# Patient Record
Sex: Female | Born: 1990 | Race: White | Hispanic: Yes | Marital: Single | Smoking: Never smoker
Health system: Southern US, Community
[De-identification: ages and names within clinical notes are randomized; demographics above are authoritative.]

## PROBLEM LIST (undated history)

## (undated) DIAGNOSIS — R87629 Unspecified abnormal cytological findings in specimens from vagina: Secondary | ICD-10-CM

## (undated) DIAGNOSIS — E039 Hypothyroidism, unspecified: Secondary | ICD-10-CM

## (undated) DIAGNOSIS — M419 Scoliosis, unspecified: Secondary | ICD-10-CM

## (undated) HISTORY — PX: OVARIAN CYST REMOVAL: SHX89

## (undated) HISTORY — PX: HERNIA REPAIR: SHX51

## (undated) HISTORY — PX: APPENDECTOMY: SHX54

## (undated) HISTORY — DX: Unspecified abnormal cytological findings in specimens from vagina: R87.629

## (undated) HISTORY — PX: OTHER SURGICAL HISTORY: SHX169

## (undated) HISTORY — PX: TONSILLECTOMY: SUR1361

## (undated) HISTORY — DX: Hypothyroidism, unspecified: E03.9

## (undated) HISTORY — DX: Scoliosis, unspecified: M41.9

---
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
## 2009-04-11 ENCOUNTER — Ambulatory Visit (HOSPITAL_COMMUNITY): Admission: RE | Admit: 2009-04-11 | Discharge: 2009-04-11 | Payer: Self-pay | Admitting: Obstetrics and Gynecology

## 2009-07-25 ENCOUNTER — Inpatient Hospital Stay (HOSPITAL_COMMUNITY): Admission: AD | Admit: 2009-07-25 | Discharge: 2009-07-27 | Payer: Self-pay | Admitting: Obstetrics and Gynecology

## 2010-05-01 LAB — CBC
HCT: 26.1 % — ABNORMAL LOW (ref 36.0–46.0)
Hemoglobin: 11.9 g/dL — ABNORMAL LOW (ref 12.0–15.0)
Hemoglobin: 9.2 g/dL — ABNORMAL LOW (ref 12.0–15.0)
MCHC: 34.8 g/dL (ref 30.0–36.0)
MCV: 97.8 fL (ref 78.0–100.0)
Platelets: 179 10*3/uL (ref 150–400)
RBC: 2.68 MIL/uL — ABNORMAL LOW (ref 3.87–5.11)
RBC: 3.52 MIL/uL — ABNORMAL LOW (ref 3.87–5.11)
WBC: 15.3 10*3/uL — ABNORMAL HIGH (ref 4.0–10.5)
WBC: 15.5 10*3/uL — ABNORMAL HIGH (ref 4.0–10.5)

## 2010-05-01 LAB — RH IMMUNE GLOB WKUP(>/=20WKS)(NOT WOMEN'S HOSP)

## 2010-05-04 LAB — RH IMMUNE GLOBULIN WORKUP (NOT WOMEN'S HOSP)
ABO/RH(D): A NEG
Antibody Screen: NEGATIVE

## 2011-05-07 ENCOUNTER — Emergency Department (HOSPITAL_COMMUNITY): Payer: BC Managed Care – PPO

## 2011-05-07 ENCOUNTER — Emergency Department (HOSPITAL_COMMUNITY)
Admission: EM | Admit: 2011-05-07 | Discharge: 2011-05-07 | Disposition: A | Discharge status: Home / Self Care | Payer: BC Managed Care – PPO | Attending: Emergency Medicine | Admitting: Emergency Medicine

## 2011-05-07 ENCOUNTER — Encounter (HOSPITAL_COMMUNITY): Payer: Self-pay

## 2011-05-07 DIAGNOSIS — S42009A Fracture of unspecified part of unspecified clavicle, initial encounter for closed fracture: Secondary | ICD-10-CM | POA: Insufficient documentation

## 2011-05-07 DIAGNOSIS — Y92410 Unspecified street and highway as the place of occurrence of the external cause: Secondary | ICD-10-CM | POA: Insufficient documentation

## 2011-05-07 DIAGNOSIS — S42002A Fracture of unspecified part of left clavicle, initial encounter for closed fracture: Secondary | ICD-10-CM

## 2011-05-07 DIAGNOSIS — M25519 Pain in unspecified shoulder: Secondary | ICD-10-CM | POA: Insufficient documentation

## 2011-05-07 DIAGNOSIS — M79609 Pain in unspecified limb: Secondary | ICD-10-CM | POA: Insufficient documentation

## 2011-05-07 DIAGNOSIS — R112 Nausea with vomiting, unspecified: Secondary | ICD-10-CM | POA: Insufficient documentation

## 2011-05-07 LAB — DIFFERENTIAL
Basophils Relative: 1 % (ref 0–1)
Eosinophils Absolute: 0.2 10*3/uL (ref 0.0–0.7)
Lymphs Abs: 1.9 10*3/uL (ref 0.7–4.0)
Monocytes Absolute: 0.5 10*3/uL (ref 0.1–1.0)
Monocytes Relative: 5 % (ref 3–12)

## 2011-05-07 LAB — CBC
HCT: 34.7 % — ABNORMAL LOW (ref 36.0–46.0)
Hemoglobin: 11.6 g/dL — ABNORMAL LOW (ref 12.0–15.0)
MCH: 29.6 pg (ref 26.0–34.0)
MCHC: 33.4 g/dL (ref 30.0–36.0)
MCV: 88.5 fL (ref 78.0–100.0)
RBC: 3.92 MIL/uL (ref 3.87–5.11)

## 2011-05-07 LAB — POCT I-STAT, CHEM 8
BUN: 14 mg/dL (ref 6–23)
Chloride: 106 mEq/L (ref 96–112)
Creatinine, Ser: 0.6 mg/dL (ref 0.50–1.10)
Sodium: 141 mEq/L (ref 135–145)
TCO2: 24 mmol/L (ref 0–100)

## 2011-05-07 LAB — URINE MICROSCOPIC-ADD ON

## 2011-05-07 LAB — URINALYSIS, ROUTINE W REFLEX MICROSCOPIC
Bilirubin Urine: NEGATIVE
Glucose, UA: NEGATIVE mg/dL
Protein, ur: NEGATIVE mg/dL
Urobilinogen, UA: 0.2 mg/dL (ref 0.0–1.0)

## 2011-05-07 MED ORDER — ONDANSETRON HCL 4 MG/2ML IJ SOLN
INTRAMUSCULAR | Status: AC
  Administered 2011-05-07: 4 mg via INTRAVENOUS
  Filled 2011-05-07: qty 2

## 2011-05-07 MED ORDER — MORPHINE SULFATE 4 MG/ML IJ SOLN
4.0000 mg | Freq: Once | INTRAMUSCULAR | Status: AC
  Administered 2011-05-07: 4 mg via INTRAVENOUS
  Filled 2011-05-07: qty 1

## 2011-05-07 MED ORDER — ONDANSETRON HCL 4 MG/2ML IJ SOLN
INTRAMUSCULAR | Status: AC
  Administered 2011-05-07: 4 mg
  Filled 2011-05-07: qty 2

## 2011-05-07 MED ORDER — ONDANSETRON HCL 4 MG/2ML IJ SOLN
4.0000 mg | Freq: Once | INTRAMUSCULAR | Status: AC
  Administered 2011-05-07: 4 mg via INTRAVENOUS

## 2011-05-07 MED ORDER — ONDANSETRON 4 MG PO TBDP: 4.0000 mg | ORAL_TABLET | Freq: Three times a day (TID) | ORAL | Status: AC | PRN

## 2011-05-07 MED ORDER — OXYCODONE-ACETAMINOPHEN 5-325 MG PO TABS: 1.0000 | ORAL_TABLET | ORAL | Status: AC | PRN

## 2011-05-07 MED ORDER — SODIUM CHLORIDE 0.9 % IV SOLN
INTRAVENOUS | Status: DC
  Administered 2011-05-07: 125 mL/h via INTRAVENOUS
  Administered 2011-05-07: 18:00:00 via INTRAVENOUS

## 2011-05-07 MED ORDER — ONDANSETRON HCL 4 MG/2ML IJ SOLN
4.0000 mg | Freq: Once | INTRAMUSCULAR | Status: AC
  Administered 2011-05-07: 4 mg via INTRAVENOUS
  Filled 2011-05-07: qty 2
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
## 2011-05-07 NOTE — Progress Notes (Signed)
Orthopedic Tech Progress Note Patient Details:  Laura Bolton July 13, 1990 [id number]  Other Ortho Devices Type of Ortho Device: Other (comment) (clavical strap;foam arm sling) Ortho Device Location: left arm Ortho Device Interventions: Application   Nikki Dom 05/07/2011, 7:51 PM

## 2011-05-08 ENCOUNTER — Other Ambulatory Visit: Payer: Self-pay | Admitting: Sports Medicine

## 2011-05-08 ENCOUNTER — Ambulatory Visit
Admission: RE | Admit: 2011-05-08 | Discharge: 2011-05-08 | Disposition: A | Discharge status: Home / Self Care | Payer: BC Managed Care – PPO | Source: Ambulatory Visit | Attending: Sports Medicine | Admitting: Sports Medicine

## 2011-05-08 DIAGNOSIS — T148XXA Other injury of unspecified body region, initial encounter: Secondary | ICD-10-CM

## 2011-05-08 DIAGNOSIS — R52 Pain, unspecified: Secondary | ICD-10-CM

## 2012-06-17 ENCOUNTER — Encounter: Payer: Self-pay | Admitting: Obstetrics and Gynecology
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
## 2013-06-13 IMAGING — CT CT CHEST LIMITED W/O CM
3 of 4 series · 17 of 30 positions shown, 20 images · non-contrast
Comparison: Left clavicle fracture.

CLINICAL DATA: CHEST CT LIMITED WITHOUT CONTRAST
TECHNIQUE: Multidetector CT imaging of the left clavicle was
performed using standard protocol with coronal and sagittal
reformatted images.

[Series 2: limited chest/clavicle · axial · 0.70mm/px · z∈[-109,-26]mm · 4 of 49 slices shown]
[im 13/49  lung]
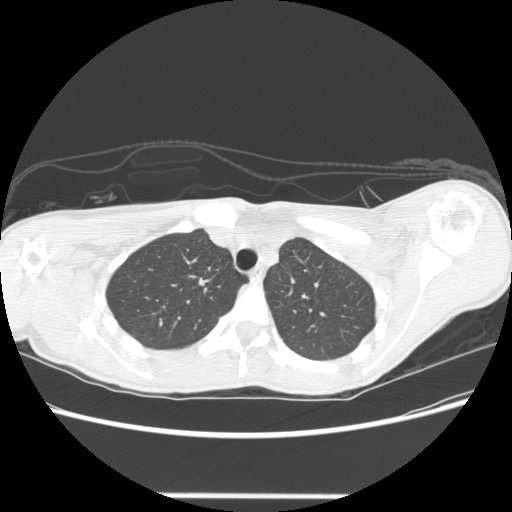
[im 25/49  lung]
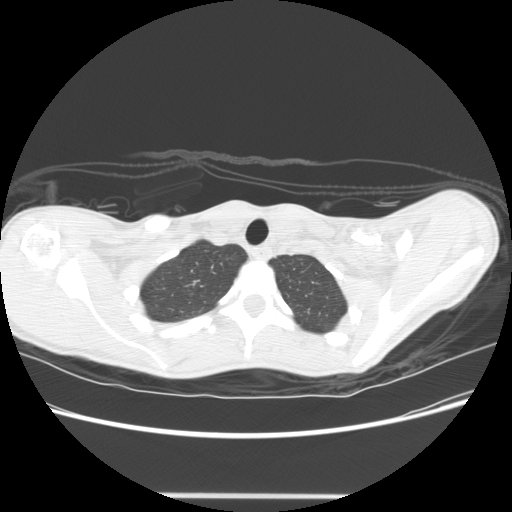
[im 37/49  lung]
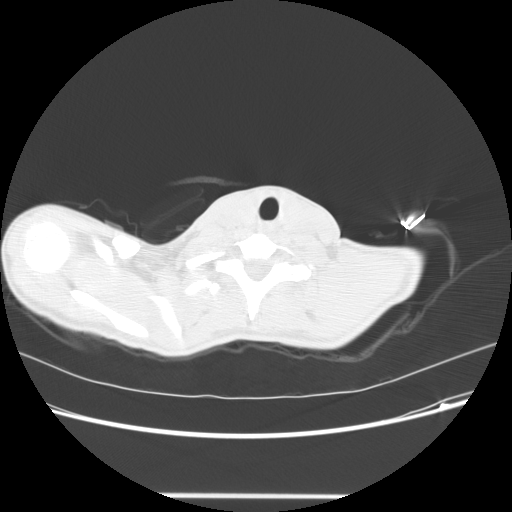
[im 46/49  lung]
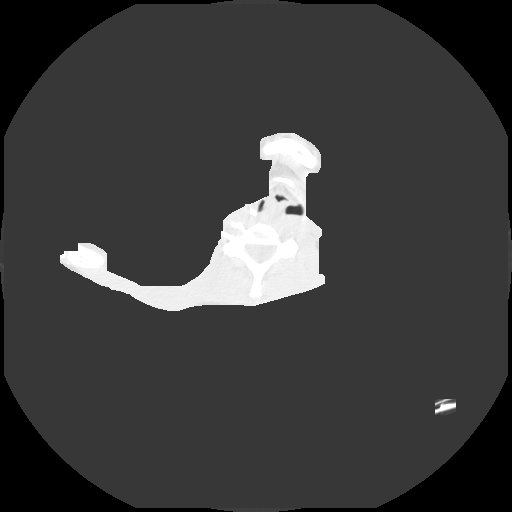

[Series 400: cor · coronal · 0.70mm/px · 2 of 73 slices shown]
[im 15/73  lung]
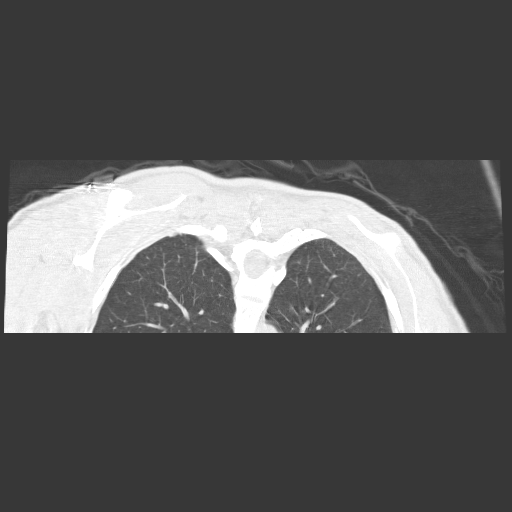
[im 29/73  lung]
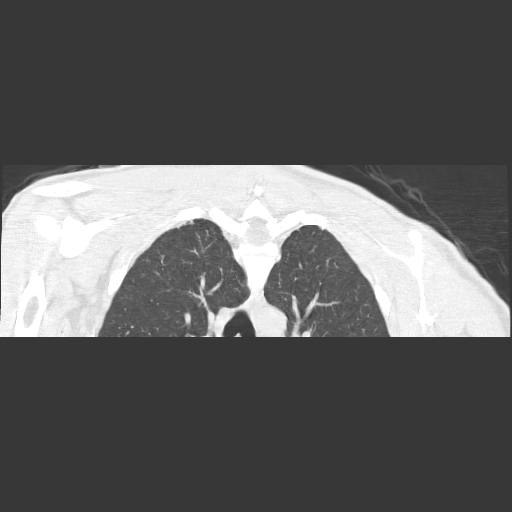

[Series 401: sag · sagittal · 0.70mm/px · 11 of 145 slices shown, 14 images]
[im 13/145  mediastinal]
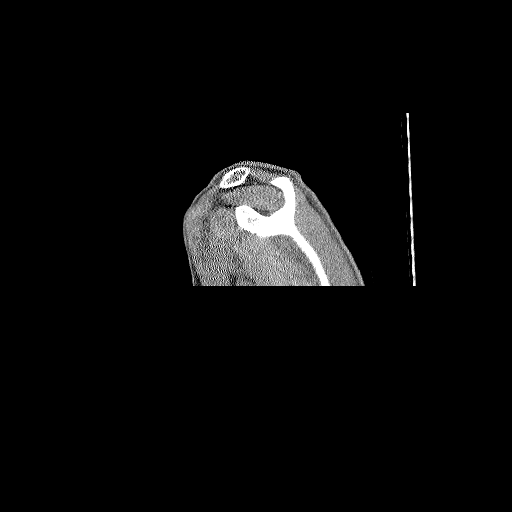
[im 13/145  lung]
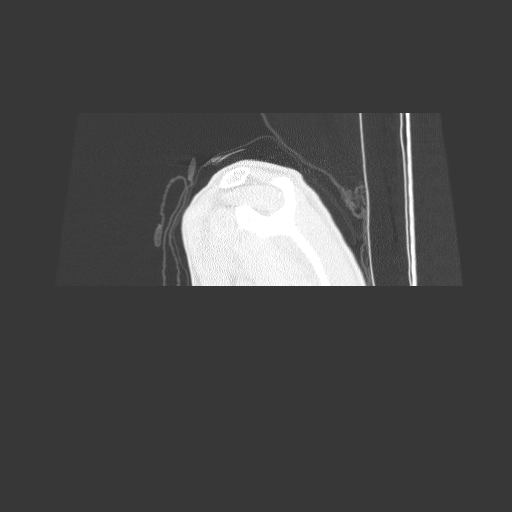
[im 25/145  lung]
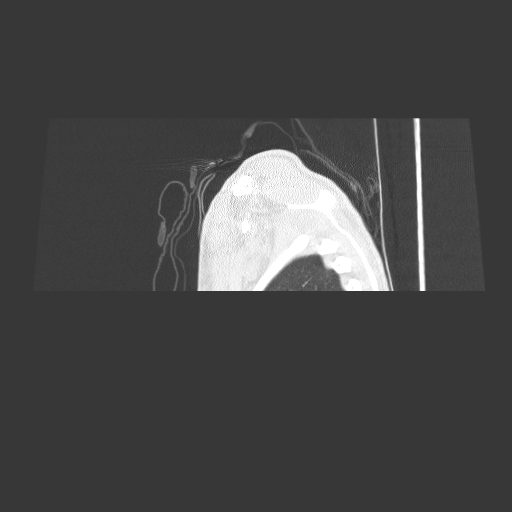
[im 37/145  lung]
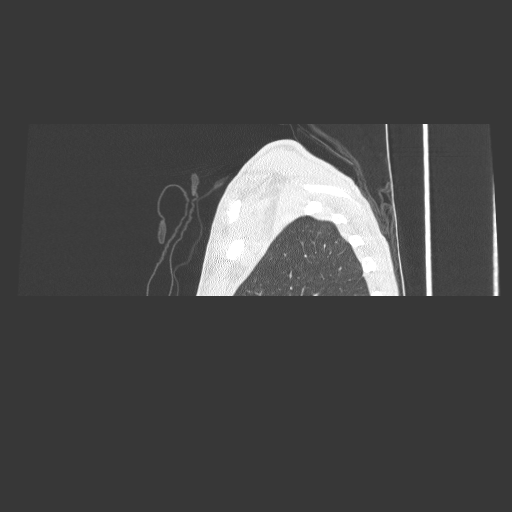
[im 49/145  lung]
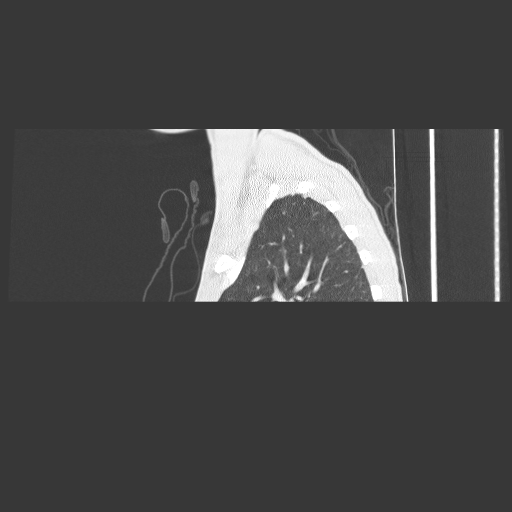
[im 61/145  mediastinal]
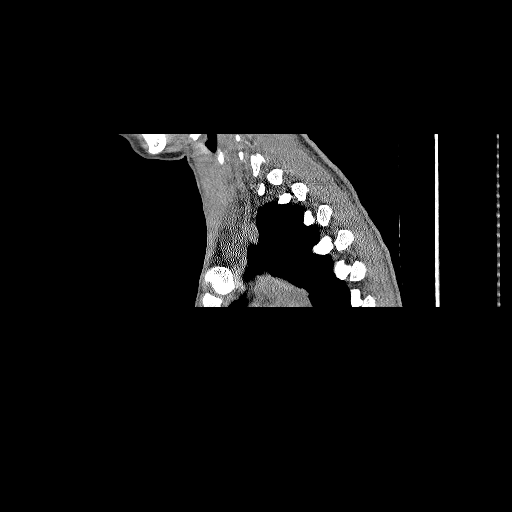
[im 61/145  lung]
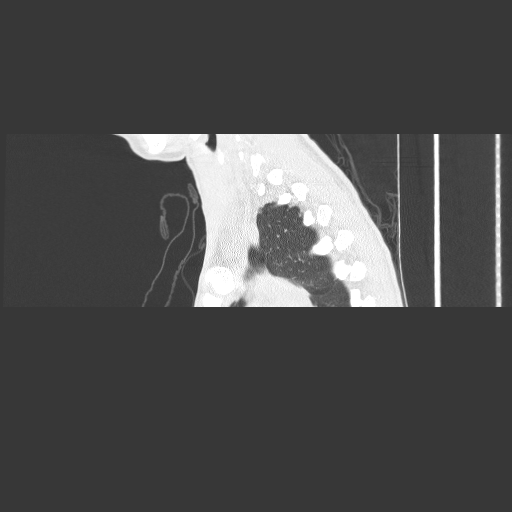
[im 73/145  lung]
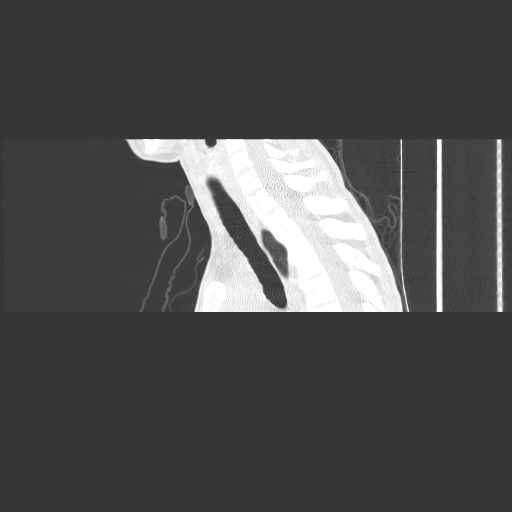
[im 85/145  lung]
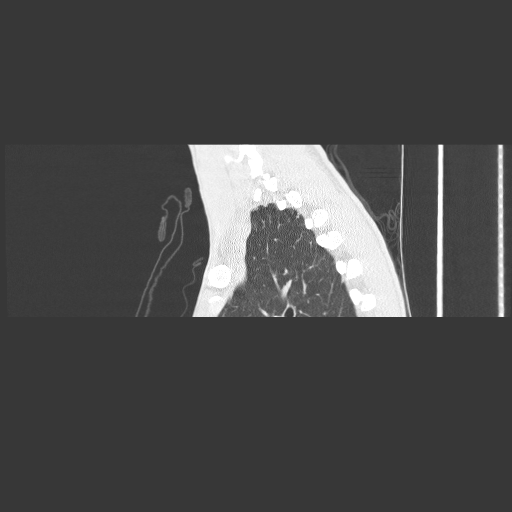
[im 97/145  lung]
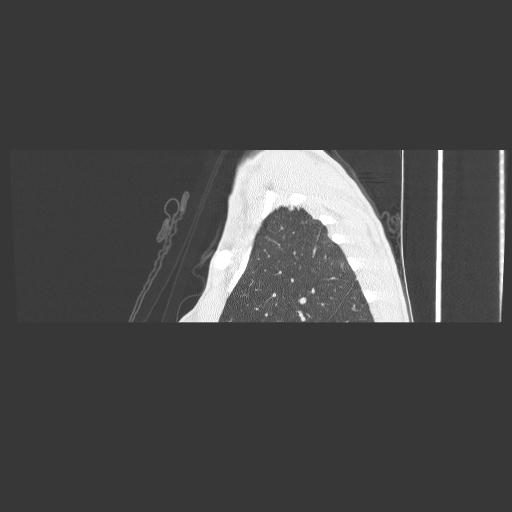
[im 109/145  mediastinal]
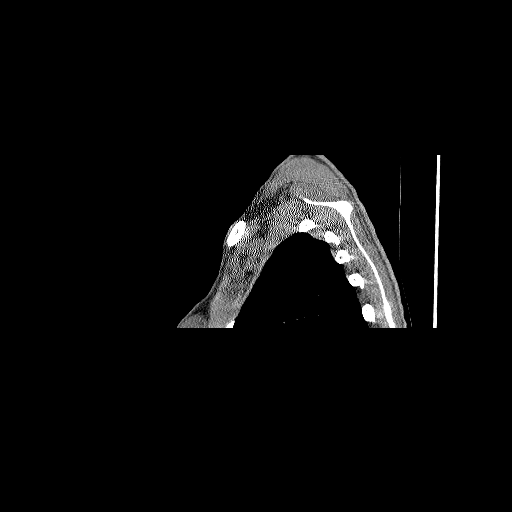
[im 109/145  lung]
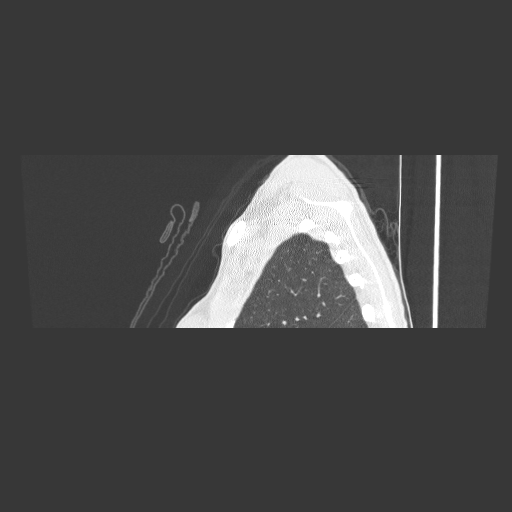
[im 121/145  lung]
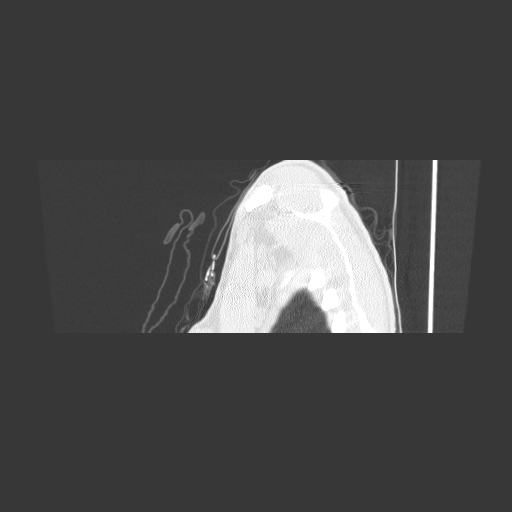
[im 133/145  lung]
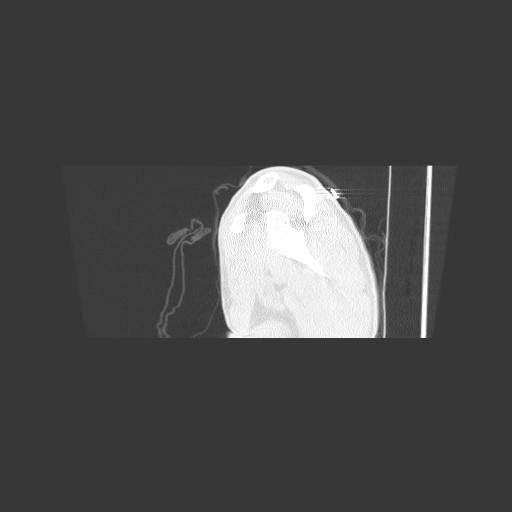

[17 of 30 positions shown; findings below may reference images not displayed]

FINDINGS: There is a complex comminuted displaced fracture of the
left mid clavicle.  There is approximately 17 mm of elevation of
the displaced clavicle fracture with a small intervening butterfly
fragment.  The coracoclavicular distance is normal.  The AC joint
is intact.  The sternoclavicular joint is intact. Please see the 3-
D reformats.
IMPRESSION: Comminuted and displaced mid clavicle fracture as discussed above.

## 2013-09-29 ENCOUNTER — Ambulatory Visit: Payer: BC Managed Care – PPO | Admitting: Internal Medicine

## 2013-11-11 ENCOUNTER — Ambulatory Visit: Payer: Self-pay | Admitting: Family Medicine

## 2014-06-04 LAB — HM PAP SMEAR: HM Pap smear: NEGATIVE

## 2014-08-12 ENCOUNTER — Encounter: Payer: Self-pay | Admitting: *Deleted

## 2014-08-13 ENCOUNTER — Encounter: Payer: Self-pay | Admitting: Obstetrics and Gynecology

## 2014-08-13 ENCOUNTER — Ambulatory Visit (INDEPENDENT_AMBULATORY_CARE_PROVIDER_SITE_OTHER): Payer: Medicaid Other | Admitting: Obstetrics and Gynecology

## 2014-08-13 VITALS — BP 91/57 | HR 68 | Ht 63.0 in | Wt 97.5 lb

## 2014-08-13 DIAGNOSIS — N760 Acute vaginitis: Secondary | ICD-10-CM | POA: Diagnosis not present

## 2014-08-13 DIAGNOSIS — A499 Bacterial infection, unspecified: Secondary | ICD-10-CM | POA: Diagnosis not present

## 2014-08-13 DIAGNOSIS — B9689 Other specified bacterial agents as the cause of diseases classified elsewhere: Secondary | ICD-10-CM

## 2014-08-13 MED ORDER — METRONIDAZOLE 500 MG PO TABS: 500.0000 mg | ORAL_TABLET | Freq: Two times a day (BID) | ORAL | Status: AC

## 2014-08-13 NOTE — Patient Instructions (Signed)
Bacterial Vaginosis Bacterial vaginosis is a vaginal infection that occurs when the normal balance of bacteria in the vagina is disrupted. It results from an overgrowth of certain bacteria. This is the most common vaginal infection in women of childbearing age. Treatment is important to prevent complications, especially in pregnant women, as it can cause a premature delivery. CAUSES  Bacterial vaginosis is caused by an increase in harmful bacteria that are normally present in smaller amounts in the vagina. Several different kinds of bacteria can cause bacterial vaginosis. However, the reason that the condition develops is not fully understood. RISK FACTORS Certain activities or behaviors can put you at an increased risk of developing bacterial vaginosis, including:  Having a new sex partner or multiple sex partners.  Douching.  Using an intrauterine device (IUD) for contraception. Women do not get bacterial vaginosis from toilet seats, bedding, swimming pools, or contact with objects around them. SIGNS AND SYMPTOMS  Some women with bacterial vaginosis have no signs or symptoms. Common symptoms include:  Grey vaginal discharge.  A fishlike odor with discharge, especially after sexual intercourse.  Itching or burning of the vagina and vulva.  Burning or pain with urination. DIAGNOSIS  Your health care provider will take a medical history and examine the vagina for signs of bacterial vaginosis. A sample of vaginal fluid may be taken. Your health care provider will look at this sample under a microscope to check for bacteria and abnormal cells. A vaginal pH test may also be done.  TREATMENT  Bacterial vaginosis may be treated with antibiotic medicines. These may be given in the form of a pill or a vaginal cream. A second round of antibiotics may be prescribed if the condition comes back after treatment.  HOME CARE INSTRUCTIONS   Only take over-the-counter or prescription medicines as  directed by your health care provider.  If antibiotic medicine was prescribed, take it as directed. Make sure you finish it even if you start to feel better.  Do not have sex until treatment is completed.  Tell all sexual partners that you have a vaginal infection. They should see their health care provider and be treated if they have problems, such as a mild rash or itching.  Practice safe sex by using condoms and only having one sex partner. SEEK MEDICAL CARE IF:   Your symptoms are not improving after 3 days of treatment.  You have increased discharge or pain.  You have a fever. MAKE SURE YOU:   Understand these instructions.  Will watch your condition.  Will get help right away if you are not doing well or get worse. FOR MORE INFORMATION  Centers for Disease Control and Prevention, Division of STD Prevention: www.cdc.gov/std American Sexual Health Association (ASHA): www.ashastd.org  Document Released: 01/29/2005 Document Revised: 11/19/2012 Document Reviewed: 09/10/2012 ExitCare Patient Information 2015 [employer]. This information is not intended to replace advice given to you by your health care provider. Make sure you discuss any questions you have with your health care provider.  

## 2014-08-13 NOTE — Progress Notes (Signed)
Subjective:     Patient ID: Laura GlazierDiahna Bolton, female   DOB: May 03, 1990, 24 y.o.   MRN: [id number]  HPI Reports vaginal discharge with odor  Review of Systems Increase white discharge x 2 weeks with strong fishy odor, worse after sex. Tried OTC suppositories with no changes. Reports same female partner. On OCPs, denies vaginal itching/irritation or pain.    Objective:   Physical Exam External vulvar pink and without lesions Vaginal vault normal rugae with out lesions  Cervix nulliparous with thin white d/c noted  wetprep + Clue, Occassional WBC, negtaive yeast, neg trich, + whiff    Assessment:     BV     Plan:     Counseled on normal causes of BV Rx for Flagyl bid x 5-7 days with refills sent in  RTC PRN  Yolanda BonineMelody Burr, CNM

## 2014-12-08 ENCOUNTER — Ambulatory Visit: Payer: Medicaid Other | Admitting: Obstetrics and Gynecology

## 2014-12-13 ENCOUNTER — Ambulatory Visit (INDEPENDENT_AMBULATORY_CARE_PROVIDER_SITE_OTHER): Payer: Medicaid Other | Admitting: Obstetrics and Gynecology

## 2014-12-13 ENCOUNTER — Encounter: Payer: Self-pay | Admitting: Obstetrics and Gynecology

## 2014-12-13 VITALS — BP 99/64 | HR 60 | Wt 99.3 lb

## 2014-12-13 DIAGNOSIS — E038 Other specified hypothyroidism: Secondary | ICD-10-CM

## 2014-12-13 DIAGNOSIS — Z331 Pregnant state, incidental: Secondary | ICD-10-CM

## 2014-12-13 DIAGNOSIS — Z113 Encounter for screening for infections with a predominantly sexual mode of transmission: Secondary | ICD-10-CM

## 2014-12-13 DIAGNOSIS — Z3687 Encounter for antenatal screening for uncertain dates: Secondary | ICD-10-CM

## 2014-12-13 DIAGNOSIS — Z369 Encounter for antenatal screening, unspecified: Secondary | ICD-10-CM

## 2014-12-13 DIAGNOSIS — Z3490 Encounter for supervision of normal pregnancy, unspecified, unspecified trimester: Secondary | ICD-10-CM

## 2014-12-13 DIAGNOSIS — Z36 Encounter for antenatal screening of mother: Secondary | ICD-10-CM

## 2014-12-13 MED ORDER — DOXYLAMINE SUCCINATE (SLEEP) 25 MG PO TABS: 25.0000 mg | ORAL_TABLET | Freq: Every day | ORAL | Status: AC

## 2014-12-13 MED ORDER — VITAMIN B-6 25 MG PO TABS: 25.0000 mg | ORAL_TABLET | Freq: Three times a day (TID) | ORAL | Status: AC

## 2014-12-13 NOTE — Patient Instructions (Signed)

## 2014-12-13 NOTE — Progress Notes (Signed)
Laura GlazierDiahna Dolce presents for NOB nurse interview visit. G-.2  P-1001. Confirmation for pregnancy at [facility] on 12/07/14.  Pregnancy eduction material explained and given. No cats in the home. NOB labs ordered. TSH ordered because of Hypothyroidism.  HIV labs and Drug screen were explained optional and she could opt out of tests but did not decline. Drug screen ordered. PNV encouraged. Currently taking pregnancy vitamins given to her by [facility] and will call for Concept DHA or Prenate+DHA Mini when she needs more. Also needs something for nausea. NT ordered to discuss with provider. Pt. To follow up with provider in  4 weeks for NOB physical. Ultrasound ordered for viability and dating. All questions answered.  ZIKA EXPOSURE SCREEN:  The patient has not traveled to a [place] endemic area within the past 6 months, nor has she had unprotected sex with a partner who has travelled to a [place] endemic region within the past 6 months. The patient has been advised to notify us if these factors change any time during this current pregnancy, so adequate testing and monitoring can be initiated.

## 2014-12-15 LAB — URINALYSIS, ROUTINE W REFLEX MICROSCOPIC
BILIRUBIN UA: NEGATIVE
Glucose, UA: NEGATIVE
Ketones, UA: NEGATIVE
Leukocytes, UA: NEGATIVE
NITRITE UA: NEGATIVE
PH UA: 7 (ref 5.0–7.5)
PROTEIN UA: NEGATIVE
RBC UA: NEGATIVE
Specific Gravity, UA: 1.011 (ref 1.005–1.030)
UUROB: 0.2 mg/dL (ref 0.2–1.0)

## 2014-12-15 LAB — GC/CHLAMYDIA PROBE AMP
CHLAMYDIA, DNA PROBE: NEGATIVE
NEISSERIA GONORRHOEAE BY PCR: NEGATIVE

## 2014-12-16 ENCOUNTER — Encounter: Payer: Self-pay | Admitting: Obstetrics and Gynecology

## 2014-12-16 LAB — PAIN MGT SCRN (14 DRUGS), UR
Amphetamine Screen, Ur: NEGATIVE ng/mL
BARBITURATE SCRN UR: NEGATIVE ng/mL
BUPRENORPHINE, URINE: NEGATIVE ng/mL
Benzodiazepine Screen, Urine: NEGATIVE ng/mL
Cannabinoids Ur Ql Scn: NEGATIVE ng/mL
Cocaine(Metab.)Screen, Urine: NEGATIVE ng/mL
Creatinine(Crt), U: 51 mg/dL (ref 20.0–300.0)
Fentanyl, Urine: NEGATIVE pg/mL
Meperidine Screen, Urine: NEGATIVE ng/mL
Methadone Scn, Ur: NEGATIVE ng/mL
OPIATE SCRN UR: NEGATIVE ng/mL
Oxycodone+Oxymorphone Ur Ql Scn: NEGATIVE ng/mL
PCP Scrn, Ur: NEGATIVE ng/mL
PH UR, DRUG SCRN: 6.8 (ref 4.5–8.9)
Propoxyphene, Screen: NEGATIVE ng/mL
TRAMADOL UR QL SCN: NEGATIVE ng/mL

## 2014-12-16 LAB — NICOTINE SCREEN, URINE: Cotinine Ql Scrn, Ur: NEGATIVE ng/mL

## 2014-12-18 LAB — CULTURE, OB URINE

## 2014-12-18 LAB — URINE CULTURE, OB REFLEX

## 2014-12-21 ENCOUNTER — Ambulatory Visit: Payer: Medicaid Other

## 2014-12-21 DIAGNOSIS — Z3490 Encounter for supervision of normal pregnancy, unspecified, unspecified trimester: Secondary | ICD-10-CM

## 2014-12-21 DIAGNOSIS — Z3687 Encounter for antenatal screening for uncertain dates: Secondary | ICD-10-CM

## 2014-12-21 DIAGNOSIS — Z369 Encounter for antenatal screening, unspecified: Secondary | ICD-10-CM

## 2014-12-21 DIAGNOSIS — Z331 Pregnant state, incidental: Secondary | ICD-10-CM | POA: Diagnosis not present

## 2014-12-21 DIAGNOSIS — Z36 Encounter for antenatal screening of mother: Secondary | ICD-10-CM | POA: Diagnosis not present

## 2014-12-29 ENCOUNTER — Other Ambulatory Visit: Payer: Medicaid Other

## 2014-12-30 ENCOUNTER — Other Ambulatory Visit: Payer: Self-pay | Admitting: Obstetrics and Gynecology

## 2014-12-30 DIAGNOSIS — Z2839 Other underimmunization status: Secondary | ICD-10-CM

## 2014-12-30 DIAGNOSIS — O360110 Maternal care for anti-D [Rh] antibodies, first trimester, not applicable or unspecified: Secondary | ICD-10-CM

## 2014-12-30 DIAGNOSIS — O9989 Other specified diseases and conditions complicating pregnancy, childbirth and the puerperium: Principal | ICD-10-CM

## 2014-12-30 DIAGNOSIS — Z283 Underimmunization status: Secondary | ICD-10-CM

## 2014-12-30 DIAGNOSIS — Z6791 Unspecified blood type, Rh negative: Secondary | ICD-10-CM | POA: Insufficient documentation

## 2014-12-30 DIAGNOSIS — O26899 Other specified pregnancy related conditions, unspecified trimester: Secondary | ICD-10-CM | POA: Insufficient documentation

## 2014-12-30 DIAGNOSIS — O09899 Supervision of other high risk pregnancies, unspecified trimester: Secondary | ICD-10-CM | POA: Insufficient documentation

## 2014-12-30 LAB — CBC WITH DIFFERENTIAL/PLATELET
BASOS: 0 %
Basophils Absolute: 0 10*3/uL (ref 0.0–0.2)
EOS (ABSOLUTE): 0.3 10*3/uL (ref 0.0–0.4)
EOS: 5 %
HEMATOCRIT: 32.2 % — AB (ref 34.0–46.6)
Hemoglobin: 11.2 g/dL (ref 11.1–15.9)
Immature Grans (Abs): 0 10*3/uL (ref 0.0–0.1)
Immature Granulocytes: 0 %
LYMPHS ABS: 1.4 10*3/uL (ref 0.7–3.1)
Lymphs: 20 %
MCH: 30.8 pg (ref 26.6–33.0)
MCHC: 34.8 g/dL (ref 31.5–35.7)
MCV: 89 fL (ref 79–97)
MONOS ABS: 0.4 10*3/uL (ref 0.1–0.9)
Monocytes: 6 %
Neutrophils Absolute: 4.6 10*3/uL (ref 1.4–7.0)
Neutrophils: 69 %
PLATELETS: 247 10*3/uL (ref 150–379)
RBC: 3.64 x10E6/uL — ABNORMAL LOW (ref 3.77–5.28)
RDW: 12.4 % (ref 12.3–15.4)
WBC: 6.8 10*3/uL (ref 3.4–10.8)

## 2014-12-30 LAB — HEPATITIS B SURFACE ANTIGEN: HEP B S AG: NEGATIVE

## 2014-12-30 LAB — RPR: RPR: NONREACTIVE

## 2014-12-30 LAB — ANTIBODY SCREEN: Antibody Screen: NEGATIVE

## 2014-12-30 LAB — RH TYPE: Rh Factor: NEGATIVE

## 2014-12-30 LAB — HIV ANTIBODY (ROUTINE TESTING W REFLEX): HIV Screen 4th Generation wRfx: NONREACTIVE

## 2014-12-30 LAB — RUBELLA ANTIBODY, IGM: Rubella IgM: <20 AU/mL (ref 0.0–19.9)

## 2014-12-30 LAB — TSH: TSH: 2.42 u[IU]/mL (ref 0.450–4.500)

## 2014-12-30 LAB — ABO

## 2014-12-30 LAB — VARICELLA ZOSTER ANTIBODY, IGM: Varicella IgM: <0.91 index (ref 0.00–0.90)

## 2015-01-13 ENCOUNTER — Encounter: Payer: Self-pay | Admitting: Obstetrics and Gynecology

## 2015-01-13 ENCOUNTER — Ambulatory Visit (INDEPENDENT_AMBULATORY_CARE_PROVIDER_SITE_OTHER): Payer: Medicaid Other | Admitting: Obstetrics and Gynecology

## 2015-01-13 VITALS — BP 94/54 | HR 62 | Wt 97.8 lb

## 2015-01-13 DIAGNOSIS — Z3491 Encounter for supervision of normal pregnancy, unspecified, first trimester: Secondary | ICD-10-CM

## 2015-01-13 LAB — POCT URINALYSIS DIPSTICK
BILIRUBIN UA: NEGATIVE
Glucose, UA: NEGATIVE
KETONES UA: NEGATIVE
Leukocytes, UA: NEGATIVE
Nitrite, UA: NEGATIVE
PH UA: 7.5
RBC UA: NEGATIVE
SPEC GRAV UA: 1.01
Urobilinogen, UA: 0.2

## 2015-01-13 NOTE — Patient Instructions (Signed)

## 2015-01-13 NOTE — Progress Notes (Signed)
NOB physical- pt denies any complaints

## 2015-01-13 NOTE — Progress Notes (Signed)
NOB - doing well except fatigue. Normal exam, labs reviewed, panarama drawn,

## 2015-01-24 ENCOUNTER — Encounter: Payer: Self-pay | Admitting: Obstetrics and Gynecology

## 2015-01-25 ENCOUNTER — Other Ambulatory Visit: Payer: Self-pay | Admitting: Obstetrics and Gynecology

## 2015-02-10 ENCOUNTER — Encounter: Payer: Self-pay | Admitting: Obstetrics and Gynecology

## 2015-02-10 ENCOUNTER — Ambulatory Visit (INDEPENDENT_AMBULATORY_CARE_PROVIDER_SITE_OTHER): Payer: Medicaid Other | Admitting: Obstetrics and Gynecology

## 2015-02-10 VITALS — BP 87/47 | HR 64 | Wt 100.5 lb

## 2015-02-10 DIAGNOSIS — Z331 Pregnant state, incidental: Secondary | ICD-10-CM

## 2015-02-10 LAB — POCT URINALYSIS DIPSTICK
Bilirubin, UA: NEGATIVE
Blood, UA: NEGATIVE
Glucose, UA: NEGATIVE
KETONES UA: NEGATIVE
LEUKOCYTES UA: NEGATIVE
Nitrite, UA: NEGATIVE
PROTEIN UA: NEGATIVE
Spec Grav, UA: 1.01
Urobilinogen, UA: 0.2
pH, UA: 7

## 2015-02-10 MED ORDER — INFLUENZA VAC SPLIT QUAD 0.5 ML IM SUSY
0.5000 mL | PREFILLED_SYRINGE | Freq: Once | INTRAMUSCULAR | Status: AC
  Administered 2015-02-10: 0.5 mL via INTRAMUSCULAR

## 2015-02-10 NOTE — Patient Instructions (Signed)

## 2015-02-10 NOTE — Progress Notes (Signed)
ROB- flu vaccine given,recommend daily colace stool softner as needed. Anatomy scan next visit, [facility] [place] [facility] [place] after next appoitment.

## 2015-02-10 NOTE — Progress Notes (Signed)
ROB-pt is feeling some better, she is having some indigestion would like something for that

## 2015-02-10 NOTE — Addendum Note (Signed)
Addended by: Rosine BeatLONTZ, AMY L on: 02/10/2015 04:20 PM   Modules accepted: Orders

## 2015-03-11 ENCOUNTER — Encounter: Payer: Self-pay | Admitting: Obstetrics and Gynecology

## 2015-03-11 ENCOUNTER — Ambulatory Visit (INDEPENDENT_AMBULATORY_CARE_PROVIDER_SITE_OTHER): Payer: Medicaid Other | Admitting: Obstetrics and Gynecology

## 2015-03-11 ENCOUNTER — Ambulatory Visit (INDEPENDENT_AMBULATORY_CARE_PROVIDER_SITE_OTHER): Payer: Medicaid Other

## 2015-03-11 VITALS — BP 104/64 | HR 77 | Wt 106.2 lb

## 2015-03-11 DIAGNOSIS — O4442 Low lying placenta NOS or without hemorrhage, second trimester: Secondary | ICD-10-CM

## 2015-03-11 DIAGNOSIS — Z331 Pregnant state, incidental: Secondary | ICD-10-CM | POA: Diagnosis not present

## 2015-03-11 LAB — POCT URINALYSIS DIPSTICK
Bilirubin, UA: NEGATIVE
Glucose, UA: NEGATIVE
Ketones, UA: NEGATIVE
Leukocytes, UA: NEGATIVE
NITRITE UA: NEGATIVE
PH UA: 7
PROTEIN UA: NEGATIVE
RBC UA: NEGATIVE
SPEC GRAV UA: 1.01
UROBILINOGEN UA: 0.2

## 2015-03-11 MED ORDER — PRENATE MINI 29-0.6-0.4-350 MG PO CAPS: 1.0000 | ORAL_CAPSULE | Freq: Every day | ORAL | Status: AC

## 2015-03-11 NOTE — Progress Notes (Signed)
Findings:  Singleton intrauterine pregnancy is visualized with FHR at 145 BPM. Biometrics give an (U/S) Gestational age of [redacted] weeks 4 days, and an (U/S) EDD of 08/01/15; this correlates with the clinically established EDD of 08/05/15.  Fetal presentation is variable (start of exam, transverse, middle of exam, breech, end of exam vertex).  EFW: 308 grams ( 0 lbs. 11 oz. ). Placenta: Posterior, grade 1 and low lying at 2.0 cm from cervical os.  AFI: adequate with a MVP of 5.1 cm.   Anatomic survey is complete and appears normal. Gender - female.   Right Ovary measures 3.6 x 1.7 x 2.5 cm, and appears normal. Left Ovary measures 3.1 x 1.7 x 1.9 cm, and appears normal. There is no evidence of a corpus luteal cyst. Survey of the adnexa demonstrates no adnexal masses. There is no free peritoneal fluid in the cul de sac.  Impression: 1. 19 week 4 day Viable Singleton Intrauterine pregnancy by U/S. 2. (U/S) EDD is consistent with Clinically established (LMP) EDD of 08/05/15. 3.  Low lying posterior placenta at 2.0 cm from cervical os.

## 2015-03-11 NOTE — Progress Notes (Signed)
ROB-pt is c/o stuffy head, runny nose, anatomy scan done

## 2015-06-10 ENCOUNTER — Encounter: Payer: Self-pay | Admitting: Obstetrics and Gynecology

## 2015-10-18 ENCOUNTER — Encounter (HOSPITAL_COMMUNITY): Payer: Self-pay
